# Patient Record
Sex: Male | Born: 1995 | Race: White | Hispanic: No | Marital: Single | Smoking: Never smoker
Health system: Southern US, Community
[De-identification: ages and names within clinical notes are randomized; demographics above are authoritative.]

## PROBLEM LIST (undated history)

## (undated) DIAGNOSIS — R51 Headache: Secondary | ICD-10-CM

## (undated) HISTORY — DX: Headache: R51

---
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
## 2016-03-04 ENCOUNTER — Encounter: Payer: Self-pay | Admitting: Pediatrics

## 2016-03-04 ENCOUNTER — Other Ambulatory Visit: Payer: Self-pay | Admitting: Pediatrics

## 2016-03-31 ENCOUNTER — Ambulatory Visit: Payer: Medicaid Other | Admitting: Internal Medicine

## 2016-05-05 ENCOUNTER — Other Ambulatory Visit: Payer: Self-pay | Admitting: Pediatrics

## 2016-05-06 ENCOUNTER — Encounter: Payer: Self-pay | Admitting: Pediatrics

## 2016-05-06 NOTE — Progress Notes (Deleted)
   Patient: Jesus Bennett MRN: [id number] Sex: male DOB: 12/20/1995  Provider: Deetta Bennett,Jesus H, MD [facility]  Note type: Routine return visit  History of Present Illness: Referral Source: Jesus CollinJames R. Joyner, MD History from: {CN REFERRED [id number]}[id number]}       Chief Complaint: Migraine without aura and without status migrainosus, not intractable    Jesus Bennett is a 20 y.o. male who ***  Review of Systems: {cn system review:[id number]}  Past Medical History Past Medical History:  Diagnosis Date  . Headache(784.0)    Hospitalizations: No., Head Injury: No., Nervous System Infections: No., Immunizations up to date: Yes.    ***  Birth History       8 lbs. 5 oz. infant born at full term to a 19 year old gravida 3 para 162002 male.       Gestation complicated by nausea the 1st 4 months treated with Dramamine.       Labor lasted for 10 hours, mother received epidural anesthesia.       Operative vaginal delivery with forceps.       The patient required suctioning prior to breathing. He had jaundice in the nursery.       Growth and development was recorded in detail as normal.   Behavior History anxiety  Surgical History History reviewed. No pertinent surgical history.  Family History family history includes Cancer - Other in his maternal grandmother. Family history is negative for migraines, seizures, intellectual disabilities, blindness, deafness, birth defects, chromosomal disorder, or autism.  Social History Social History   Social History  . Marital status: Single    Spouse name: N/A  . Number of children: N/A  . Years of education: N/A   Social History Main Topics  . Smoking status: Never Smoker  . Smokeless tobacco: Never Used  . Alcohol use No  . Drug use: No  . Sexual activity: No   Other Topics Concern  . None   Social History Narrative   Jesus Bennett graduated from  [employer].  He lives with his mother. He  enjoys playing the guitar and drums and playing video games.     Allergies No Known Allergies  Physical Exam There were no vitals taken for this visit.  ***   Assessment   Discussion   Plan    Medication List       Accurate as of 05/06/16 11:53 AM. Always use your most recent med list.          ibuprofen 200 MG tablet Commonly known as:  ADVIL,MOTRIN Take 400 mg by mouth every 6 (six) hours as needed. For pain   ondansetron 4 MG disintegrating tablet Commonly known as:  ZOFRAN-ODT TAKE 1 TABLET BY MOUTH AT ONSET OF NAUSEA, MAY REPEAT ONCE IN 4HRS AS NEEDED   SUMAtriptan 20 MG/ACT nasal spray Commonly known as:  IMITREX Take one half in each nostril at onset of migraine, May repeat in 2 hours if headache persists or recurs.   tiZANidine 4 MG tablet Commonly known as:  ZANAFLEX TAKE 1 TABLET BY MOUTH DAILY AS NEEDED FOR SEVERE HEADACHE   verapamil 80 MG tablet Commonly known as:  CALAN TAKE 2 IN THE MORNING AND TAKE 2 AT NIGHT       The medication list was reviewed and reconciled. All changes or newly prescribed medications were explained.  A complete medication list was provided to the patient/caregiver.  Jesus PerlaWilliam Bennett Hickling MD

## 2016-05-07 ENCOUNTER — Ambulatory Visit: Payer: Medicaid Other | Admitting: Pediatrics

## 2016-05-31 ENCOUNTER — Other Ambulatory Visit: Payer: Self-pay | Admitting: Family

## 2016-06-03 ENCOUNTER — Telehealth (INDEPENDENT_AMBULATORY_CARE_PROVIDER_SITE_OTHER): Payer: Self-pay

## 2016-06-03 MED ORDER — TIZANIDINE HCL 4 MG PO TABS: ORAL_TABLET | ORAL | 0 refills | Status: DC

## 2016-06-03 NOTE — Telephone Encounter (Signed)
L/M informing mom that Jesus Bennett's rx has been sent over to the pharmacy

## 2016-06-03 NOTE — Telephone Encounter (Signed)
Mom called to get a refill on Tizanidine 4 mg   [phone]

## 2016-06-03 NOTE — Telephone Encounter (Signed)
Please let Mom know that I sent in Rx for 10 tablets. That should last until his appointment next week. Thanks, Inetta Fermoina

## 2016-06-11 ENCOUNTER — Ambulatory Visit (INDEPENDENT_AMBULATORY_CARE_PROVIDER_SITE_OTHER): Payer: Medicaid Other | Admitting: Pediatrics

## 2016-06-11 ENCOUNTER — Encounter (INDEPENDENT_AMBULATORY_CARE_PROVIDER_SITE_OTHER): Payer: Self-pay | Admitting: Pediatrics

## 2016-06-11 VITALS — BP 104/78 | HR 88 | Ht 70.0 in | Wt 191.2 lb

## 2016-06-11 DIAGNOSIS — G43019 Migraine without aura, intractable, without status migrainosus: Secondary | ICD-10-CM

## 2016-06-11 DIAGNOSIS — G44229 Chronic tension-type headache, not intractable: Secondary | ICD-10-CM

## 2016-06-11 MED ORDER — ONDANSETRON 4 MG PO TBDP: ORAL_TABLET | ORAL | 5 refills | Status: AC

## 2016-06-11 MED ORDER — TOPIRAMATE 25 MG PO TABS
ORAL_TABLET | ORAL | 5 refills | Status: DC
Start: 2016-06-11 — End: 2017-01-17

## 2016-06-11 MED ORDER — VERAPAMIL HCL 80 MG PO TABS: ORAL_TABLET | ORAL | 5 refills | Status: DC

## 2016-06-11 MED ORDER — TIZANIDINE HCL 4 MG PO TABS: ORAL_TABLET | ORAL | 5 refills | Status: DC

## 2016-06-11 MED ORDER — SUMATRIPTAN 20 MG/ACT NA SOLN: NASAL | 5 refills | Status: DC

## 2016-06-11 NOTE — Progress Notes (Signed)
Patient: Jesus Bennett MRN: [id number] Sex: male DOB: 05/28/1996  Provider: Deetta PerlaHICKLING,Jusiah H, MD [facility]  Note type: Routine return visit  History of Present Illness: Referral Source: Jamal CollinJames R. Joyner, MD History [facility]: mother, [facility] Chief Complaint: Migraines  Jesus Bennett is a 20 y.o. male who was evaluated on June 11, 2016, for the first time since August 29, 2015.  He has daily headaches that are mixture of tension-type and migraines.  These symptoms have been fairly stable over the last nine months since he was seen.  He has diligently kept his headache calendars, but unfortunately has not sent them.  Looking at him overall, there is stability both of the frequency and intensity of his tension headaches and migraines.  In January 2017, there were 17 tension headaches, five required treatment and three migraines, one severe.  In February 2017, there were 25 tension headaches, four required treatment and three migraines, one severe.  In March 2017, there were 27 tension headaches, 10 required treatment, and four migraines, two severe.  In April 2017, there were 26 tension headaches 13 required treatment, and four migraines, one of them severe.  In May, there were 26 tension headaches, 14 required treatment, and five migraines, two of them severe.  In June 2017, there were 24 tension headaches, 14 required treatment and 6 migraines one of them severe.  In July 2017, there were 26 tension headaches, 14 required treatment, and 5 migraines, one of them severe.  In August, there were 23 tension headaches, 14 required treatment, and 8 migraines, 3 of them severe.  In September there were 24 tension-type headaches, 14 required treatment, and six migraines, one of them severe.  In October, there were 18 tension-type headaches, 8 required treatment, and 7 migraines, none of them severe.  Looking at this trend since January,  the number of migraines has increased, the number of tension headaches has been fairly stable, although the number of tension headaches requiring treatment has increased.  Jesus Bennett has graduated from [employer] and is at home.  He is not working outside the home.  His general health is good.  Currently, he takes verapamil 160 mg twice daily.  I am uncomfortable increasing the dose further.  He has been treated with propranolol, imipramine, nortriptyline, and topiramate in doses up to 300 mg.  It appears that Depakote is the only antiepileptic medication that has not been tried as a preventative.  Jesus Bennett is heavy and this could be problematic.  Abortive medications that tried and failed have been Imitrex oral and nasal spray, Maxalt, Benadryl, cetirizine, Zyrtec, Dramamine, ibuprofen, naproxen, meloxicam, and baclofen.  [facility] and his headaches were not brought under control with these treatments when he came to my office for care.  His general health is good.  He has gained 2.8 pounds since his last visit.  His BMI today is 27.43.  Review of Systems: 12 system review was assessed and was negative  Past Medical History Diagnosis Date  . Headache(784.0)    Hospitalizations: No., Head Injury: No., Nervous System Infections: No., Immunizations up to date: Yes.    Intractable migraines and [facility] Previous preventative treatments included propranolol, imipramine, nortriptyline, and topiramate in a dose up to 300 mg. Abortive medications included Imitrex, nasal spray, and Maxalt. Other abortive medications included Benadryl, cetirizine, Zyrtec, Dramamine, ibuprofen, naproxen, meloxicam, and baclofen.  CT scan of the brain on July 29, 2012, was normal.  Birth History 8 lbs. 5 oz. infant born at full term to a 75 year old gravida 3 para 38 male.  Gestation complicated by nausea the 1st 4 months  treated with Dramamine.  Labor lasted for 10 hours, mother received epidural anesthesia.  Operative vaginal delivery with forceps.  The patient required suctioning prior to breathing. He had jaundice in the nursery.  Growth and development was recorded in detail as normal.   Behavior History anxiety  Surgical History History reviewed. No pertinent surgical history.  Family History family history includes Cancer - Other in his maternal grandmother. Family history is negative for migraines, seizures, intellectual disabilities, blindness, deafness, birth defects, chromosomal disorder, or autism.  Social History . Marital status: Single    Spouse name: N/A  . Number of children: N/A  . Years of education: N/A   Social History Main Topics  . Smoking status: Never Smoker  . Smokeless tobacco: Never Used  . Alcohol use No  . Drug use: No  . Sexual activity: No   Social History Narrative    Zacari graduated from [employer].      He lives with his mother.     He enjoys playing the guitar and drums and playing video games.   No Known Allergies  Physical Exam BP 104/78   Pulse 88   Ht 5\' 10"  (1.778 m)   Wt 191 lb 3.2 oz (86.7 kg)   BMI 27.43 kg/m   General: alert, well developed, well nourished, in no acute distress, brown hair, blue eyes, right handed Head: normocephalic, no dysmorphic features Ears, Nose and Throat: Otoscopic: tympanic membranes normal; pharynx: oropharynx is pink without exudates or tonsillar hypertrophy Neck: supple, full range of motion, no cranial or cervical bruits Respiratory: auscultation clear Cardiovascular: no murmurs, pulses are normal Musculoskeletal: no skeletal deformities or apparent scoliosis Skin: no rashes or neurocutaneous lesions  Neurologic Exam  Mental Status: alert; oriented to person, place and year; knowledge is normal for age; language is normal Cranial Nerves: visual fields are full to double simultaneous  stimuli; extraocular movements are full and conjugate; pupils are round reactive to light; funduscopic examination shows sharp disc margins with normal vessels; symmetric facial strength; midline tongue and uvula; air conduction is greater than bone conduction bilaterally Motor: Normal strength, tone and mass; good fine motor movements; no pronator drift Sensory: intact responses to cold, vibration, proprioception and stereognosis Coordination: good finger-to-nose, rapid repetitive alternating movements and finger apposition Gait and Station: normal gait and station: patient is able to walk on heels, toes and tandem without difficulty; balance is adequate; Romberg exam is negative; Gower response is negative Reflexes: symmetric and diminished bilaterally; no clonus; bilateral flexor plantar responses  Assessment 1. 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 2. Chronic tension-type headache, not intractable, G44.229.  Discussion In discussion with Krishiv and his mother, I told her that Depakote was the only medication that he had not tried that might be useful in this setting.  I am worried that it may cause weight gain, which would tend to exacerbate weight gain.  Together, we decided to add low-dose topiramate to this verapamil to see if the combination might prove to be more effective than either medication itself.  I have had success in this area before.  Plan I refilled prescriptions for ondansetron, sumatriptan nasal spray, verapamil, and tizanidine.  I asked him to sign up for My Chart so that he  can send his calendars to me on a monthly basis to facilitate communication and changing medication.  He will return to see me in three months' time.  I will be in touch with him sooner based on improved contact if he signs up for My Chart.  I emphasized the need to see his headache calendars monthly and praised him for keeping the calendars so faithfully.  I spent 30  minutes of face-to-face time with Delma and his mother.   Medication List   Accurate as of 06/11/16 11:59 PM.      ibuprofen 200 MG tablet Commonly known as:  ADVIL,MOTRIN Take 400 mg by mouth every 6 (six) hours as needed. For pain   ondansetron 4 MG disintegrating tablet Commonly known as:  ZOFRAN-ODT TAKE 1 TABLET BY MOUTH AT ONSET OF NAUSEA, MAY REPEAT ONCE IN 4HRS AS NEEDED   SUMAtriptan 20 MG/ACT nasal spray Commonly known as:  IMITREX Take one half in each nostril at onset of migraine, May repeat in 2 hours if headache persists or recurs.   tiZANidine 4 MG tablet Commonly known as:  ZANAFLEX TAKE 1 TABLET BY MOUTH DAILY AS NEEDED FOR SEVERE HEADACHE   topiramate 25 MG tablet Commonly known as:  TOPAMAX Take 1 tablet at bedtime for one week then 2 tablets at bedtime   verapamil 80 MG tablet Commonly known as:  CALAN TAKE 2 IN THE MORNING AND TAKE 2 AT NIGHT       The medication list was reviewed and reconciled. All changes or newly prescribed medications were explained.  A complete medication list was provided to the patient/caregiver.  Deetta Perla MD

## 2016-06-11 NOTE — Patient Instructions (Signed)
Please sign up for My Chart so that we can communicate more effectively.  Exert that year sleeping 8 hours at night, drinking 48 ounces of fluid per day and not skipping meals.  He did a good job keeping a headache calendar but I want to see it at the end of each month.  You can do this through My Chart.

## 2016-08-20 ENCOUNTER — Other Ambulatory Visit: Payer: Self-pay | Admitting: Pediatrics

## 2016-08-20 DIAGNOSIS — G43009 Migraine without aura, not intractable, without status migrainosus: Secondary | ICD-10-CM

## 2016-10-11 ENCOUNTER — Other Ambulatory Visit (INDEPENDENT_AMBULATORY_CARE_PROVIDER_SITE_OTHER): Payer: Self-pay | Admitting: Pediatrics

## 2016-10-24 ENCOUNTER — Encounter (INDEPENDENT_AMBULATORY_CARE_PROVIDER_SITE_OTHER): Payer: Self-pay | Admitting: *Deleted

## 2017-01-17 ENCOUNTER — Other Ambulatory Visit (INDEPENDENT_AMBULATORY_CARE_PROVIDER_SITE_OTHER): Payer: Self-pay | Admitting: Pediatrics

## 2017-02-02 ENCOUNTER — Telehealth (INDEPENDENT_AMBULATORY_CARE_PROVIDER_SITE_OTHER): Payer: Self-pay | Admitting: *Deleted

## 2017-02-02 ENCOUNTER — Other Ambulatory Visit (INDEPENDENT_AMBULATORY_CARE_PROVIDER_SITE_OTHER): Payer: Self-pay | Admitting: Pediatrics

## 2017-02-02 NOTE — Telephone Encounter (Signed)
Called patient's family and left voicemail for family to return my call when possible for scheduling.  

## 2017-02-03 NOTE — Telephone Encounter (Signed)
Please follow up with patient's family to get an appt on the books for future refills.

## 2017-02-17 ENCOUNTER — Other Ambulatory Visit (INDEPENDENT_AMBULATORY_CARE_PROVIDER_SITE_OTHER): Payer: Self-pay | Admitting: Pediatrics

## 2017-03-01 ENCOUNTER — Other Ambulatory Visit (INDEPENDENT_AMBULATORY_CARE_PROVIDER_SITE_OTHER): Payer: Self-pay | Admitting: Pediatrics

## 2017-03-03 ENCOUNTER — Other Ambulatory Visit (INDEPENDENT_AMBULATORY_CARE_PROVIDER_SITE_OTHER): Payer: Self-pay | Admitting: Pediatrics

## 2017-03-03 DIAGNOSIS — G43019 Migraine without aura, intractable, without status migrainosus: Secondary | ICD-10-CM

## 2017-03-13 ENCOUNTER — Encounter (INDEPENDENT_AMBULATORY_CARE_PROVIDER_SITE_OTHER): Payer: Self-pay | Admitting: Pediatrics

## 2017-03-13 ENCOUNTER — Ambulatory Visit (INDEPENDENT_AMBULATORY_CARE_PROVIDER_SITE_OTHER): Payer: Medicaid Other | Admitting: Pediatrics

## 2017-03-13 VITALS — BP 100/62 | HR 88 | Ht 70.75 in | Wt 179.2 lb

## 2017-03-13 DIAGNOSIS — G43009 Migraine without aura, not intractable, without status migrainosus: Secondary | ICD-10-CM | POA: Diagnosis not present

## 2017-03-13 DIAGNOSIS — G44219 Episodic tension-type headache, not intractable: Secondary | ICD-10-CM | POA: Diagnosis not present

## 2017-03-13 MED ORDER — TOPIRAMATE 25 MG PO TABS: ORAL_TABLET | ORAL | 5 refills | Status: DC

## 2017-03-13 MED ORDER — SUMATRIPTAN 20 MG/ACT NA SOLN: NASAL | 5 refills | Status: AC

## 2017-03-13 MED ORDER — VERAPAMIL HCL 80 MG PO TABS: ORAL_TABLET | ORAL | 5 refills | Status: DC

## 2017-03-13 NOTE — Patient Instructions (Addendum)
There are 3 lifestyle behaviors that are important to minimize headaches.  You should sleep 8-9 hours at night time.  Bedtime should be a set time for going to bed and waking up with few exceptions.  You need to drink about 48 ounces of water per day, more on days when you are out in the heat.  This works out to 2 - 16 ounce water bottles per day.  You may need to flavor the water so that you will be more likely to drink it.  Do not use Kool-Aid or other sugar drinks because they add empty calories and actually increase urine output.  You need to eat 3 meals per day.  You should not skip meals.  The meal does not have to be a big one.  Make daily entries into the headache calendar and sent it to me at the end of each calendar month.  I will call you or your parents and we will discuss the results of the headache calendar and make a decision about changing treatment if indicated.  You should take 400 mg of ibuprofen and 20 mg of nasal sumatriptan at the onset of headaches that are severe enough to cause obvious pain and other symptoms.  You may repeat the sumatriptan in 2 hours if you have not experienced relief.  Please sign for My Chart and send a headache calendar to me at the end of each calendar month.

## 2017-03-13 NOTE — Progress Notes (Signed)
Patient: Jesus Bennett MRN: [id number] Sex: male DOB: November 02, 1995  Provider: Ellison Carwin, MD Location of Care: [facility]  Note type: Routine return visit  History of Present Illness: Referral Source: Jesus Collin, MD History from: patient and [facility] chart Chief Complaint: Migraines  Jesus Bennett is a 21 y.o. male who was evaluated on March 13, 2017, for the first time since June 11, 2016.  He has migraine and tension-type headaches that at the time I last saw him were daily.  At that time, he was keeping headache calendars.  He has not over the past 9 months.  He estimates that he has had 10 migraines since his last visit, which works out to a little more than 1 per month.    With migraines, he has nausea, vomiting, and blurred vision during the headache.  Of note is that he has lost 12 pounds since I last saw him.  This is a mixture of increased physical activity and more careful eating.    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  He works from 1 p.m. to 5 p.m., five times a week.  He goes to bed at 1 or 2 in the morning and gets up at between 8 and 10 in the morning.    He had to call out of work on 2 consecutive days, the 16th and 17th of July.  He awakened with a severe headache and could not work.  Altogether, in the 4 months that he has worked, he has missed a total of 4 days because of headaches.  The headache that caused him to miss work began on awakening and waxed and waned over 2 days.  It was frontal, retro-orbital, pounding.  He had nausea with vomiting and the vomiting eased his symptoms.  He had blurred vision lasting for seconds in his center vision.  He took nasal sumatriptan, which lessened his headache the first day.  He had to take 2 doses on the second day to bring about relief.  In addition to having somewhat erratic hours as regards to sleep, he does not eat breakfast.  He may have a granola bar before lunch and then  eats dinner and snacks in the evening.  Review of Systems: 12 system review was remarkable for experienced ten migraines since last visit, small headaches sporadically, nausea, vomitng, blurred vision; the remainder was assessed and was negative  Past Medical History Diagnosis Date  . Headache(784.0)    Hospitalizations: No., Head Injury: No., Nervous System Infections: No., Immunizations up to date: Yes.    Intractable migraines and was previously treated with headache in [facility]. Previous preventative treatments included propranolol, imipramine, nortriptyline, and topiramate in a dose up to 300 mg. Abortive medications included Imitrex, nasal spray, and Maxalt. Other abortive medications included Benadryl, cetirizine, Zyrtec, Dramamine, ibuprofen, naproxen, meloxicam, and baclofen.  CT scan of the brain on July 29, 2012, was normal.  Birth History 8 lbs. 5 oz. infant born at full term to a 4 year old gravida 3 para 90 male.  Gestation complicated by nausea the 1st 4 months treated with Dramamine.  Labor lasted for 10 hours, mother received epidural anesthesia.  Operative vaginal delivery with forceps.  The patient required suctioning prior to breathing. He had jaundice in the nursery.  Growth and development was recorded in detail as normal.  Behavior History anxiety  Surgical History No past surgical history on file.  Family History family history includes  Cancer - Other in his maternal grandmother. Family history is negative for migraines, seizures, intellectual disabilities, blindness, deafness, birth defects, chromosomal disorder, or autism.  Social History . Marital status: Single  . Years of education: 5213   Social History Main Topics  . Smoking status: Never Smoker  . Smokeless tobacco: Never Used  . Alcohol use No  . Drug use: No  . Sexual activity: No   Social History Narrative    Jesus Bennett graduated from [employer].      He  lives with his mother.     He enjoys playing the guitar and drums and playing video games.   No Known Allergies  Physical Exam BP 100/62   Pulse 88   Ht 5' 10.75" (1.797 m)   Wt 179 lb 3.2 oz (81.3 kg)   BMI 25.17 kg/m   General: alert, well developed, well nourished, in no acute distress, brown hair, blue eyes, right handed Head: normocephalic, no dysmorphic features Ears, Nose and Throat: Otoscopic: tympanic membranes normal; pharynx: oropharynx is pink without exudates or tonsillar hypertrophy Neck: supple, full range of motion, no cranial or cervical bruits Respiratory: auscultation clear Cardiovascular: no murmurs, pulses are normal Musculoskeletal: no skeletal deformities or apparent scoliosis Skin: no rashes or neurocutaneous lesions  Neurologic Exam  Mental Status: alert; oriented to person, place and year; knowledge is normal for age; language is normal Cranial Nerves: visual fields are full to double simultaneous stimuli; extraocular movements are full and conjugate; pupils are round reactive to light; funduscopic examination shows sharp disc margins with normal vessels; symmetric facial strength; midline tongue and uvula; air conduction is greater than bone conduction bilaterally Motor: Normal strength, tone and mass; good fine motor movements; no pronator drift Sensory: intact responses to cold, vibration, proprioception and stereognosis Coordination: good finger-to-nose, rapid repetitive alternating movements and finger apposition Gait and Station: normal gait and station: patient is able to walk on heels, toes and tandem without difficulty; balance is adequate; Romberg exam is negative; Gower response is negative Reflexes: symmetric and diminished bilaterally; no clonus; bilateral flexor plantar responses  Assessment 1. Migraine without aura without status migrainosus, not intractable, G43.009. 2. Episodic tension-type headache, not intractable,  G44.219.  Discussion I am concerned about the frequency of his headaches and the potential impact it may have on his job.  I am not ready to place him on preventative medication yet, but if the trend continues, headaches will be frequent enough that placing him on a preventative medication may be necessary.  Plan I asked him to begin to adjust his bedtime, to go to bed somewhat earlier, so that he can stay in some regular pattern of wake and sleep, eating, and drinking fluid.  I want him to drink 48 ounces per day, to eat 3 meals per day even if they are not large, and to take 400 mg of ibuprofen plus 20 mg of sumatriptan nasal at the onset of his headache.  If this does not work, we may need to move to a different triptan.  I asked him to sign up for MyChart to communicate with me by sending his calendars to my office, so that I can determine how well he is responding to treatment.   I spent 30 minutes of face-to-face time with him.  He will return to see me in 3 months.  I will be in contact with him as long as he sends his calendars monthly.   Medication List   Accurate as of 03/13/17  11:23 AM.      ibuprofen 200 MG tablet Commonly known as:  ADVIL,MOTRIN Take 400 mg by mouth every 6 (six) hours as needed. For pain   ondansetron 4 MG disintegrating tablet Commonly known as:  ZOFRAN-ODT TAKE 1 TABLET BY MOUTH AT ONSET OF NAUSEA, MAY REPEAT ONCE IN 4HRS AS NEEDED   SUMAtriptan 20 MG/ACT nasal spray Commonly known as:  IMITREX Take one half in each nostril at onset of migraine, May repeat in 2 hours if headache persists or recurs.   tiZANidine 4 MG tablet Commonly known as:  ZANAFLEX TAKE 1 TABLET DAILY AS NEEDED FOR SEVERE HEADACHE   topiramate 25 MG tablet Commonly known as:  TOPAMAX TAKE 1 TABLET AT BEDTIME FOR 1 WEEK THEN INCREASE TO 2 TABLETS AT BEDTIME   verapamil 80 MG tablet Commonly known as:  CALAN TAKE 2 TABLETS IN THE MORNING AND 2 TABLETS AT NIGHT    The medication  list was reviewed and reconciled. All changes or newly prescribed medications were explained.  A complete medication list was provided to the patient/caregiver.  Deetta PerlaWilliam H Eulas Schweitzer MD

## 2017-03-26 ENCOUNTER — Other Ambulatory Visit (INDEPENDENT_AMBULATORY_CARE_PROVIDER_SITE_OTHER): Payer: Self-pay | Admitting: Pediatrics

## 2017-03-27 NOTE — Telephone Encounter (Signed)
Rx sent 

## 2017-04-07 ENCOUNTER — Telehealth (INDEPENDENT_AMBULATORY_CARE_PROVIDER_SITE_OTHER): Payer: Self-pay | Admitting: Pediatrics

## 2017-04-07 DIAGNOSIS — G43009 Migraine without aura, not intractable, without status migrainosus: Secondary | ICD-10-CM

## 2017-04-07 MED ORDER — TOPIRAMATE 25 MG PO TABS: ORAL_TABLET | ORAL | 4 refills | Status: DC

## 2017-04-07 NOTE — Telephone Encounter (Signed)
°  Who's calling (name and relationship to patient) : Arbutus Leas, mother Best contact number: [phone] Provider they see: Sharene Skeans Reason for call:     PRESCRIPTION REFILL ONLY  Name of prescription: Topiramate  Pharmacy: [facility] [id number]

## 2017-04-07 NOTE — Telephone Encounter (Signed)
Rx has been electronically sent 

## 2017-07-28 ENCOUNTER — Other Ambulatory Visit (INDEPENDENT_AMBULATORY_CARE_PROVIDER_SITE_OTHER): Payer: Self-pay | Admitting: Pediatrics

## 2017-07-30 ENCOUNTER — Telehealth (INDEPENDENT_AMBULATORY_CARE_PROVIDER_SITE_OTHER): Payer: Self-pay | Admitting: Pediatrics

## 2017-07-30 NOTE — Telephone Encounter (Signed)
07/29/17 Received fax on 07/29/17 @ 4:55pm from [facility], Mom called in requesting a refill for TIZANIDINE, stated that pharmacy had received a denial from the Provider, Mom also stated that pt only has 2 pills left as of yesterday. Mom requests a call back as soon as possible please.  Pr fax, on call Provider was paged(Dr Artis FlockWolfe), message result from call - Rep spoke to on call provider and connected call with parent. (07/29/17 @ 4:40pm).  07/30/17 Called Mom back in 07/30/17 @ 3:58pm, Mom stated that she is expecting a call from Dr Sharene SkeansHickling to explain why the rx refill was denied, she did speak to the on call Provider yesterday, but has not heard back from anyone today.  Renae Zehner/Mom  Ph# [phone]

## 2017-07-31 ENCOUNTER — Other Ambulatory Visit (INDEPENDENT_AMBULATORY_CARE_PROVIDER_SITE_OTHER): Payer: Self-pay | Admitting: Pediatrics

## 2017-07-31 MED ORDER — TIZANIDINE HCL 4 MG PO TABS: 4.0000 mg | ORAL_TABLET | Freq: Every day | ORAL | 5 refills | Status: DC

## 2017-07-31 NOTE — Progress Notes (Signed)
Mother called on call phone because refill for zanaflex was refused.  In reviewing, it was refused because refill was too early.  Mother states that he has been taking 1 pill daily for headaches, the prescription was for one pill PRN severe headache with 20 pills monthly.  I discussed with Dr Sharene SkeansHickling who agreed to changing prescription to 1 pill daily.  I sent new prescription to pharmacy and called mother to inform her of the change.    Lorenz CoasterStephanie Desmond Tufano MD MPH

## 2017-07-31 NOTE — Telephone Encounter (Signed)
Spoke to mom to inform her that the refill has been sent to the pharmacy as of 9:35 am. Per Faby, the pharmacy is not refilling the medications that has refills so it was denied. I explained that to mom and she understood. She thanked me for calling her back

## 2017-12-26 ENCOUNTER — Other Ambulatory Visit (INDEPENDENT_AMBULATORY_CARE_PROVIDER_SITE_OTHER): Payer: Self-pay | Admitting: Pediatrics

## 2017-12-26 DIAGNOSIS — G43009 Migraine without aura, not intractable, without status migrainosus: Secondary | ICD-10-CM

## 2018-01-28 ENCOUNTER — Other Ambulatory Visit (INDEPENDENT_AMBULATORY_CARE_PROVIDER_SITE_OTHER): Payer: Self-pay | Admitting: Pediatrics

## 2018-01-28 DIAGNOSIS — G43009 Migraine without aura, not intractable, without status migrainosus: Secondary | ICD-10-CM

## 2018-01-29 ENCOUNTER — Other Ambulatory Visit (INDEPENDENT_AMBULATORY_CARE_PROVIDER_SITE_OTHER): Payer: Self-pay | Admitting: Pediatrics

## 2018-01-29 ENCOUNTER — Telehealth (INDEPENDENT_AMBULATORY_CARE_PROVIDER_SITE_OTHER): Payer: Self-pay | Admitting: Pediatrics

## 2018-01-29 DIAGNOSIS — G43009 Migraine without aura, not intractable, without status migrainosus: Secondary | ICD-10-CM

## 2018-01-29 NOTE — Telephone Encounter (Signed)
°  Who's calling (name and relationship to patient) : Renae (Mother) Best contact number: [phone] Provider they see: Dr. Sharene SkeansHickling  Reason for call: Mom stated pharmacy informed her that Tizanidine needs PA. Pt will be out of medication sometime this weekend.

## 2018-01-29 NOTE — Telephone Encounter (Signed)
Mom is aware that the PA was done and approved and the approval was sent to the pharmacy

## 2018-02-01 ENCOUNTER — Other Ambulatory Visit (INDEPENDENT_AMBULATORY_CARE_PROVIDER_SITE_OTHER): Payer: Self-pay | Admitting: Pediatrics

## 2018-02-02 ENCOUNTER — Telehealth (INDEPENDENT_AMBULATORY_CARE_PROVIDER_SITE_OTHER): Payer: Self-pay | Admitting: Pediatrics

## 2018-02-02 NOTE — Telephone Encounter (Signed)
The authorization approval has been refaxed to the pharmacy

## 2018-02-02 NOTE — Telephone Encounter (Signed)
°  Who's calling (name and relationship to patient) : Renae (mom) Best contact number: [phone] Provider they see: Sharene SkeansHickling  Reason for call:  Mom called stating that pharmacy said they did not receive the PA for the Tizanidine.  Tresa Endo(Kelly sent to pharmacy) Please call    PRESCRIPTION REFILL ONLY  Name of prescription:  Pharmacy:

## 2018-02-03 NOTE — Telephone Encounter (Signed)
°  Who's calling (name and relationship to patient) : [facility]Cameron[facility]  Best contact number: [phone] Provider they see: Sharene SkeansHickling Reason for call: Please call the pharmacy, they stated they got a denial message for the refill.  Please call     PRESCRIPTION REFILL ONLY  Name of prescription:  Pharmacy:

## 2018-02-03 NOTE — Telephone Encounter (Signed)
This has been handled.

## 2018-03-01 ENCOUNTER — Other Ambulatory Visit (INDEPENDENT_AMBULATORY_CARE_PROVIDER_SITE_OTHER): Payer: Self-pay | Admitting: Pediatrics

## 2018-03-01 DIAGNOSIS — G43009 Migraine without aura, not intractable, without status migrainosus: Secondary | ICD-10-CM

## 2018-04-21 ENCOUNTER — Other Ambulatory Visit (INDEPENDENT_AMBULATORY_CARE_PROVIDER_SITE_OTHER): Payer: Self-pay | Admitting: Pediatrics

## 2018-04-21 DIAGNOSIS — G43009 Migraine without aura, not intractable, without status migrainosus: Secondary | ICD-10-CM

## 2018-04-28 ENCOUNTER — Ambulatory Visit (INDEPENDENT_AMBULATORY_CARE_PROVIDER_SITE_OTHER): Payer: Self-pay | Admitting: Pediatrics

## 2018-04-28 ENCOUNTER — Encounter: Payer: Self-pay | Admitting: Pediatrics
